# Patient Record
Sex: Male | Born: 1945 | Race: White | Hispanic: No | Marital: Married | Smoking: Current some day smoker
Health system: Southern US, Community
[De-identification: ages and names within clinical notes are randomized; demographics above are authoritative.]

## PROBLEM LIST (undated history)

## (undated) DIAGNOSIS — K2270 Barrett's esophagus without dysplasia: Secondary | ICD-10-CM

## (undated) DIAGNOSIS — J449 Chronic obstructive pulmonary disease, unspecified: Secondary | ICD-10-CM

## (undated) DIAGNOSIS — J439 Emphysema, unspecified: Secondary | ICD-10-CM

## (undated) DIAGNOSIS — K219 Gastro-esophageal reflux disease without esophagitis: Secondary | ICD-10-CM

## (undated) DIAGNOSIS — I10 Essential (primary) hypertension: Secondary | ICD-10-CM

## (undated) DIAGNOSIS — C4490 Unspecified malignant neoplasm of skin, unspecified: Secondary | ICD-10-CM

## (undated) DIAGNOSIS — C3490 Malignant neoplasm of unspecified part of unspecified bronchus or lung: Secondary | ICD-10-CM

## (undated) HISTORY — PX: BACK SURGERY: SHX140

## (undated) HISTORY — DX: Essential (primary) hypertension: I10

## (undated) HISTORY — DX: Gastro-esophageal reflux disease without esophagitis: K21.9

## (undated) HISTORY — DX: Unspecified malignant neoplasm of skin, unspecified: C44.90

## (undated) HISTORY — DX: Malignant neoplasm of unspecified part of unspecified bronchus or lung: C34.90

## (undated) HISTORY — DX: Emphysema, unspecified: J43.9

## (undated) HISTORY — DX: Chronic obstructive pulmonary disease, unspecified: J44.9

## (undated) HISTORY — DX: Barrett's esophagus without dysplasia: K22.70

---

## 2004-01-05 ENCOUNTER — Emergency Department (HOSPITAL_COMMUNITY): Admission: EM | Admit: 2004-01-05 | Discharge: 2004-01-05 | Payer: Self-pay | Admitting: Emergency Medicine
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
## 2015-02-22 ENCOUNTER — Encounter: Payer: Self-pay | Admitting: Gastroenterology

## 2015-04-21 ENCOUNTER — Ambulatory Visit: Payer: Self-pay | Admitting: Gastroenterology

## 2015-04-23 ENCOUNTER — Encounter: Payer: Self-pay | Admitting: Gastroenterology

## 2015-06-29 ENCOUNTER — Encounter: Payer: Self-pay | Admitting: Gastroenterology

## 2015-06-29 ENCOUNTER — Ambulatory Visit (INDEPENDENT_AMBULATORY_CARE_PROVIDER_SITE_OTHER): Payer: Non-veteran care | Admitting: Gastroenterology

## 2015-06-29 VITALS — BP 90/60 | HR 57 | Ht 70.0 in | Wt 173.0 lb

## 2015-06-29 DIAGNOSIS — K2270 Barrett's esophagus without dysplasia: Secondary | ICD-10-CM

## 2015-06-29 NOTE — Progress Notes (Signed)
HPI: This is a    very pleasant 70 year old man    who was referred to me by nearby [place] clinic to evaluate  Barrett's esophagus .    Chief complaint is "I have Barrett's esophagus [facility]"  [facility].  He was diagnosed with Barrett's esophagus 5-10 years ago.   He is not aware of any of the pathologic changes, details of his Barrett's  He has had EGDs in the past and his wife said things were "worse" and they recommended he increase omeprazole to 4 pills per day, they aren't sure of the dose.  No dysphagia.  Weight has been stable.  No pyrosis.  No esophageal disease.    He had lung cancer, surgery to left  Lung in 2015.  He did not need chemotherapy.  Apparently being followed by CT scans.  He had colonoscopy in October (4-5 months ago) at the [place] and they may have found polyps.  I am not sure why he did not undergo EGD at the same time   Review of systems: Pertinent positive and negative review of systems were noted in the above HPI section. Complete review of systems was performed and was otherwise normal.   Past Medical History  Diagnosis Date  . Lung cancer ([place])   . Barrett's esophagus   . Hypertension   . GERD (gastroesophageal reflux disease)   . Skin cancer   . COPD (chronic obstructive pulmonary disease) ([place])   . Emphysema of lung [facility])     Past Surgical History  Procedure Laterality Date  . Back surgery      No current outpatient prescriptions on file.   No current facility-administered medications for this visit.    Allergies as of 06/29/2015  . (No Known Allergies)    Family History  Problem Relation Age of Onset  . Lymphoma Brother   . COPD Father     Social History   Social History  . Marital Status: Married    Spouse Name: N/A  . Number of Children: N/A  . Years of Education: N/A   Occupational History  . Not on file.   Social History Main Topics  . Smoking status: Current Some Day  Smoker  . Smokeless tobacco: Never Used  . Alcohol Use: No  . Drug Use: No  . Sexual Activity: Not on file   Other Topics Concern  . Not on file   Social History Narrative  . No narrative on file     Physical Exam: BP 90/60 mmHg  Pulse 57  Ht '5\' 10"'$  (1.778 m)  Wt 173 lb (78.472 kg)  BMI 24.82 kg/m2 Constitutional: generally well-appearing Psychiatric: alert and oriented x3 Eyes: extraocular movements intact Mouth: oral pharynx moist, no lesions Neck: supple no lymphadenopathy Cardiovascular: heart regular rate and rhythm Lungs: clear to auscultation bilaterally Abdomen: soft, nontender, nondistended, no obvious ascites, no peritoneal signs, normal bowel sounds Extremities: no lower extremity edema bilaterally Skin: no lesions on visible extremities   Assessment and plan: 70 y.o. male with  history of Barrett's esophagus  He was told 3 or 4 years ago the time of an upper endoscopy that his "Barrett's was worse". We have no records of any endoscopies or pathologic reports and so not sure exactly what he is referring to. Certainly he has no alarm symptoms of dysphagia or weight loss. His GERD is under very good control on what is either 40 mg twice daily proton  pump inhibitor or 80 mg twice daily proton pump inhibitor, neither he or his wife is really sure about the dose. He had a colonoscopy 3 or 4 months ago at the [place] for polyp surveillance. I'm not sure why  he did not have upper endoscopy at the same time but instead they sent him here for that test without any supporting records.  The plan is to get records from the [place] system see exactly status of his Barrett's, timing of most recent upper endoscopy. After reviewing those records I will decide on the timing of his next needed Barrett's surveillance examination.   Owens Loffler, MD [facility] 06/29/2015, 2:50 PM

## 2015-06-29 NOTE — Patient Instructions (Addendum)
We will get records sent from your previous gastroenterologist for review.  This will include any endoscopic (upper endoscopy in [place] from 3-4 years ago) procedures and any associated pathology reports).   We will decide about timing of next upper endoscopy after review of the above. One of your biggest health concerns is your smoking.  This increases your risk for most cancers and serious cardiovascular diseases such as strokes, heart attacks.  You should try your best to stop.  If you need assistance, please contact your PCP or Smoking Cessation Class at [facility] [phone]) or [place] (1-800-QUIT-NOW).
